# Patient Record
Sex: Male | Born: 1980 | Race: Black or African American | Hispanic: No | Marital: Single | Smoking: Current some day smoker
Health system: Southern US, Community
[De-identification: ages and names within clinical notes are randomized; demographics above are authoritative.]

---

## 2012-07-15 ENCOUNTER — Emergency Department (HOSPITAL_BASED_OUTPATIENT_CLINIC_OR_DEPARTMENT_OTHER): Payer: BC Managed Care – PPO

## 2012-07-15 ENCOUNTER — Encounter (HOSPITAL_BASED_OUTPATIENT_CLINIC_OR_DEPARTMENT_OTHER): Payer: Self-pay

## 2012-07-15 ENCOUNTER — Emergency Department (HOSPITAL_BASED_OUTPATIENT_CLINIC_OR_DEPARTMENT_OTHER)
Admission: EM | Admit: 2012-07-15 | Discharge: 2012-07-15 | Disposition: A | Discharge status: Home / Self Care | Payer: BC Managed Care – PPO | Attending: Emergency Medicine | Admitting: Emergency Medicine

## 2012-07-15 DIAGNOSIS — R509 Fever, unspecified: Secondary | ICD-10-CM | POA: Insufficient documentation

## 2012-07-15 DIAGNOSIS — IMO0001 Reserved for inherently not codable concepts without codable children: Secondary | ICD-10-CM | POA: Insufficient documentation

## 2012-07-15 DIAGNOSIS — R0602 Shortness of breath: Secondary | ICD-10-CM | POA: Insufficient documentation

## 2012-07-15 DIAGNOSIS — F17200 Nicotine dependence, unspecified, uncomplicated: Secondary | ICD-10-CM | POA: Insufficient documentation

## 2012-07-15 DIAGNOSIS — R1110 Vomiting, unspecified: Secondary | ICD-10-CM | POA: Insufficient documentation

## 2012-07-15 DIAGNOSIS — J111 Influenza due to unidentified influenza virus with other respiratory manifestations: Secondary | ICD-10-CM

## 2012-07-15 MED ORDER — OSELTAMIVIR PHOSPHATE 75 MG PO CAPS: 75.0000 mg | ORAL_CAPSULE | Freq: Two times a day (BID) | ORAL | Status: DC

## 2012-07-15 MED ORDER — HYDROCODONE-HOMATROPINE 5-1.5 MG/5ML PO SYRP: 5.0000 mL | ORAL_SOLUTION | Freq: Four times a day (QID) | ORAL | Status: AC | PRN

## 2012-07-15 MED ORDER — IBUPROFEN 800 MG PO TABS: 800.0000 mg | ORAL_TABLET | Freq: Three times a day (TID) | ORAL | Status: DC

## 2012-07-15 MED ORDER — HYDROCODONE-HOMATROPINE 5-1.5 MG/5ML PO SYRP: 5.0000 mL | ORAL_SOLUTION | Freq: Four times a day (QID) | ORAL | Status: DC | PRN

## 2012-07-15 MED ORDER — ALBUTEROL SULFATE (5 MG/ML) 0.5% IN NEBU
5.0000 mg | INHALATION_SOLUTION | Freq: Once | RESPIRATORY_TRACT | Status: AC
  Administered 2012-07-15: 5 mg via RESPIRATORY_TRACT
  Filled 2012-07-15: qty 1

## 2012-07-15 MED ORDER — IBUPROFEN 800 MG PO TABS
800.0000 mg | ORAL_TABLET | Freq: Once | ORAL | Status: AC
  Administered 2012-07-15: 800 mg via ORAL
  Filled 2012-07-15: qty 1

## 2012-07-15 MED ORDER — ACETAMINOPHEN 325 MG PO TABS
650.0000 mg | ORAL_TABLET | Freq: Once | ORAL | Status: AC
  Administered 2012-07-15: 650 mg via ORAL
  Filled 2012-07-15: qty 2

## 2012-07-15 MED ORDER — IBUPROFEN 800 MG PO TABS: 800.0000 mg | ORAL_TABLET | Freq: Three times a day (TID) | ORAL | Status: AC

## 2012-07-15 MED ORDER — OSELTAMIVIR PHOSPHATE 75 MG PO CAPS: 75.0000 mg | ORAL_CAPSULE | Freq: Two times a day (BID) | ORAL | Status: AC

## 2012-07-15 NOTE — ED Notes (Signed)
PA at bedside.

## 2012-07-15 NOTE — ED Notes (Signed)
C/o HA, fever, chills, vomiting-started this am

## 2012-07-15 NOTE — ED Provider Notes (Signed)
History     CSN: [id number]  Arrival date & time 07/15/12  2010   First MD Initiated Contact with Patient 07/15/12 2028      Chief Complaint  Patient presents with  . Fever    (Consider location/radiation/quality/duration/timing/severity/associated sxs/prior treatment) Patient is a 32 y.o. male presenting with fever. The history is provided by the patient. No language interpreter was used.  Fever Primary symptoms of the febrile illness include fever, fatigue, shortness of breath, vomiting and myalgias. Primary symptoms do not include visual change, abdominal pain, nausea, diarrhea, dysuria or arthralgias. The current episode started today. This is a new problem. The problem has been gradually worsening.  The fever began today. The fever has been unchanged since its onset. The maximum temperature recorded prior to his arrival was 100 to 100.9 F. The temperature was taken by an oral thermometer.  The shortness of breath began today. The shortness of breath developed gradually. Severity: increased SOB with giving history. The patient's medical history does not include CHF, COPD, asthma or chronic lung disease.  The vomiting began today. Vomiting occurred once. The emesis contains stomach contents.    History reviewed. No pertinent past medical history.  History reviewed. No pertinent past surgical history.  No family history on file.  History  Substance Use Topics  . Smoking status: Current Some Day Smoker  . Smokeless tobacco: Not on file  . Alcohol Use: Yes      Review of Systems  Constitutional: Positive for fever, chills and fatigue.  HENT: Negative for congestion, rhinorrhea and sinus pressure.   Eyes: Negative for photophobia and visual disturbance.  Respiratory: Positive for shortness of breath.   Cardiovascular: Negative for chest pain.  Gastrointestinal: Positive for vomiting. Negative for nausea, abdominal pain and diarrhea.  Genitourinary: Negative for dysuria.    Musculoskeletal: Positive for myalgias. Negative for arthralgias.       No recent heavy lifting or trauma  Neurological: Negative for dizziness and light-headedness.    Allergies  Review of patient's allergies indicates no known allergies.  Home Medications   Current Outpatient Rx  Name  Route  Sig  Dispense  Refill  . HYDROCODONE-HOMATROPINE 5-1.5 MG/5ML PO SYRP   Oral   Take 5 mLs by mouth every 6 (six) hours as needed for cough.   120 mL   0   . IBUPROFEN 800 MG PO TABS   Oral   Take 1 tablet (800 mg total) by mouth 3 (three) times daily.   21 tablet   0   . OSELTAMIVIR PHOSPHATE 75 MG PO CAPS   Oral   Take 1 capsule (75 mg total) by mouth 2 (two) times daily.   10 capsule   0     BP 134/76  Pulse 95  Temp 101.2 F (38.4 C) (Oral)  Resp 20  Ht 5\' 8"  (1.727 m)  Wt 180 lb (81.647 kg)  BMI 27.37 kg/m2  SpO2 100%  Physical Exam  Nursing note and vitals reviewed. Constitutional: He appears well-developed and well-nourished. No distress.  HENT:  Head: Normocephalic and atraumatic.  Eyes: Conjunctivae normal are normal. Pupils are equal, round, and reactive to light. Right eye exhibits no discharge. No scleral icterus.  Neck: Normal range of motion. Neck supple. No muscular tenderness present. No rigidity. Normal range of motion present. No Brudzinski's sign noted.  Cardiovascular: Normal rate, regular rhythm and normal heart sounds.   Pulmonary/Chest: Breath sounds normal. No respiratory distress. He has no wheezes. He exhibits no tenderness.  tachypneic  Abdominal: Soft. Bowel sounds are normal. He exhibits no distension. There is no tenderness. There is no rebound and no guarding.  Musculoskeletal: Normal range of motion. He exhibits tenderness.       Myalgias primarily shoulders, lower back, and back of legs.   Lymphadenopathy:    He has cervical adenopathy.  Neurological: He is alert.  Skin: Skin is warm. No erythema.  Psychiatric: He has a normal  mood and affect. His behavior is normal.    ED Course  Procedures (including critical care time)  Labs Reviewed - No data to display Dg Chest 2 View  07/15/2012  *RADIOLOGY REPORT*  Clinical Data: Headache with fever, chills, and vomiting  CHEST - 2 VIEW  Comparison: .  None.  Findings: Normal cardiac and mediastinal silhouette.  Clear lung fields.  No bony abnormality.  IMPRESSION: Negative chest.   Original Report Authenticated By: Davonna Belling, M.D.      1. Influenza   2. Fever       MDM  Patient presents c/o fever, headache, myalgias, and shortness of breath since this morning. Patient states he also had one episode of non-bloody, non-bilious emesis this afternoon after eating some noodle soup. Admits to sick contacts with coworkers who have had similar symptoms and diagnosed with the flu. Visibly fatigued when giving history. On physical exam patient is warm to the touch and tachypneic. Temperature at triage recorded at 101.31F. Physical exam otherwise benign. Patient given tylenol for fever. Will also obtain chest x ray to evaluate possible etiology of shortness of breath. Have ordered an albuterol neb treatment in hopes that it will help his shortness of breath symptoms as well.  Patient states SOB improved with neb treatment. Fever 1 hour after tylenol remained at 101.2. Have written for ibuprofen to be given to try and bring down the fever. CXR showed no acute abnormalities. Patient diagnosed with influenza, given the patient's history of contact with sick individuals, and onset of illness and symptoms. Plan to discharge patient with ibuprofen and hycodan. Patient does not complain of cough at this time, however possible cough will develop as today is the first day of symptoms. Patient also would like script for Tamiflu which will be written.   Antony Madura, PA-C 07/15/12 2210

## 2012-07-18 NOTE — ED Provider Notes (Signed)
Medical screening examination/treatment/procedure(s) were performed by non-physician practitioner and as supervising physician I was immediately available for consultation/collaboration.   Dione Booze, MD 07/18/12 [phone]

## 2015-08-31 ENCOUNTER — Emergency Department (HOSPITAL_BASED_OUTPATIENT_CLINIC_OR_DEPARTMENT_OTHER)
Admission: EM | Admit: 2015-08-31 | Discharge: 2015-08-31 | Disposition: A | Discharge status: Home / Self Care | Payer: Commercial Managed Care - PPO | Attending: Emergency Medicine | Admitting: Emergency Medicine

## 2015-08-31 ENCOUNTER — Encounter (HOSPITAL_BASED_OUTPATIENT_CLINIC_OR_DEPARTMENT_OTHER): Payer: Self-pay

## 2015-08-31 DIAGNOSIS — S60949A Unspecified superficial injury of unspecified finger, initial encounter: Secondary | ICD-10-CM

## 2015-08-31 DIAGNOSIS — F17200 Nicotine dependence, unspecified, uncomplicated: Secondary | ICD-10-CM | POA: Insufficient documentation

## 2015-08-31 DIAGNOSIS — Y9389 Activity, other specified: Secondary | ICD-10-CM | POA: Insufficient documentation

## 2015-08-31 DIAGNOSIS — Z79899 Other long term (current) drug therapy: Secondary | ICD-10-CM | POA: Insufficient documentation

## 2015-08-31 DIAGNOSIS — W4904XA Ring or other jewelry causing external constriction, initial encounter: Secondary | ICD-10-CM | POA: Insufficient documentation

## 2015-08-31 DIAGNOSIS — Z791 Long term (current) use of non-steroidal anti-inflammatories (NSAID): Secondary | ICD-10-CM | POA: Insufficient documentation

## 2015-08-31 DIAGNOSIS — S60942A Unspecified superficial injury of right middle finger, initial encounter: Secondary | ICD-10-CM | POA: Diagnosis present

## 2015-08-31 DIAGNOSIS — Y9289 Other specified places as the place of occurrence of the external cause: Secondary | ICD-10-CM | POA: Diagnosis not present

## 2015-08-31 DIAGNOSIS — Y998 Other external cause status: Secondary | ICD-10-CM | POA: Diagnosis not present

## 2015-08-31 NOTE — ED Notes (Signed)
Pt reports ring stuck to right middle finger x12 hours. Area swollen.

## 2015-08-31 NOTE — ED Provider Notes (Signed)
CSN: [id number]     Arrival date & time 08/31/15  1816 History   First MD Initiated Contact with Patient 08/31/15 1904     Chief Complaint  Patient presents with  . ring removal     Evelio Casad is a 35 y.o. male Who presents to the emergency department complaining of a ring stuck on his right middle finger for the past 12 hours. Patient reports he had not worn the ring for over 4 years in place on his finger last night. He reports he had difficulty taking it off afterwards. He ended up sleeping and during overnight but was unable to remove it this morning. He attempted several matted several unsuccessful to remove it. He denies any pain to his finger.   The history is provided by the patient. No language interpreter was used.    History reviewed. No pertinent past medical history. History reviewed. No pertinent past surgical history. History reviewed. No pertinent family history. Social History  Substance Use Topics  . Smoking status: Current Some Day Smoker  . Smokeless tobacco: None  . Alcohol Use: Yes    Review of Systems  Constitutional: Negative for fever.  Musculoskeletal: Negative for myalgias, joint swelling and arthralgias.  Skin: Negative for rash and wound.      Allergies  Review of patient's allergies indicates no known allergies.  Home Medications   Prior to Admission medications   Medication Sig Start Date End Date Taking? Authorizing Provider  HYDROcodone-homatropine (HYCODAN) 5-1.5 MG/5ML syrup Take 5 mLs by mouth every 6 (six) hours as needed for cough. 07/15/12   Dione Boozeavid Glick, MD  ibuprofen (ADVIL,MOTRIN) 800 MG tablet Take 1 tablet (800 mg total) by mouth 3 (three) times daily. 07/15/12   Dione Boozeavid Glick, MD  oseltamivir (TAMIFLU) 75 MG capsule Take 1 capsule (75 mg total) by mouth 2 (two) times daily. 07/15/12   Dione Boozeavid Glick, MD   BP 138/88 mmHg  Pulse 60  Temp(Src) 98.4 F (36.9 C) (Oral)  Resp 16  Ht 5\' 9"  (1.753 m)  Wt 90.719 kg  BMI 29.52 kg/m2  SpO2  100% Physical Exam  Constitutional: He appears well-developed and well-nourished. No distress.  HENT:  Head: Normocephalic and atraumatic.  Eyes: Right eye exhibits no discharge. Left eye exhibits no discharge.  Cardiovascular: Normal rate and intact distal pulses.   Right radial pulse intact. Good capillary refill to his right distal fingertips.  Pulmonary/Chest: Effort normal. No respiratory distress.  Neurological: He is alert. Coordination normal.  Skin: Skin is warm and dry. No rash noted. He is not diaphoretic. No erythema. No pallor.  Ring stuck to the patient's right middle finger. Very slight swelling of his right middle finger.  Psychiatric: He has a normal mood and affect. His behavior is normal.  Nursing note and vitals reviewed.   ED Course  Procedures (including critical care time) Labs Review Labs Reviewed - No data to display  Imaging Review No results found.    EKG Interpretation None      Filed Vitals:   08/31/15 1820 08/31/15 1821  BP:  138/88  Pulse: 60   Temp: 98.4 F (36.9 C)   TempSrc: Oral   Resp: 16   Height: 5\' 9"  (1.753 m)   Weight: 90.719 kg   SpO2: 100%      MDM   Meds given in ED:  Medications - No data to display  New Prescriptions   No medications on file    Final diagnoses:  Superficial injury of  finger, initial encounter   This  a 35 y.o. male Who presents to the emergency department complaining of a ring stuck on his right middle finger for the past 12 hours. Patient reports he had not worn the ring for over 4 years in place on his finger last night. He reports he had difficulty taking it off afterwards. He ended up sleeping and during overnight but was unable to remove it this morning. On exam patient is afebrile nontoxic appearing. He has a large ring stuck to his right middle finger. No evidence of finger injury. The ring was easily removed by nurse tech using a ring cutter. After removal patient has no injury or broken  skin. Has good range of motion of his finger. Good capillary refill. Will discharge.   Everlene Farrier, PA-C 08/31/15 1924  Rolland Porter, MD 09/10/15 2255

## 2015-08-31 NOTE — Discharge Instructions (Signed)
Buddy Taping °You have a minor finger or toe injury. It can be managed by buddy taping. Buddy taping means the injured finger or toe is taped to a healthy uninjured adjacent finger or toe. Most minor fractures and dislocations of the smaller fingers and toes will heal in 3 to 4 weeks. Buddy taping immobilizes and protects the area of injury. Buddy taping is not recommended for initial treatment of fractures of the thumb, longer fingers, or the great toe. Buddy taping should not be used for unstable or deformed fractures, but as fracture healing progresses it may be used for protection during rehabilitation. Fractured fingers and toes should be protected by buddy taping as long as the injury is still painful or swollen.  °When an injury is buddy taped, place a small piece of gauze or cotton between the digits that are taped. This helps prevent the skin from breaking down from increased moisture. Buddy taping allows you to get your injury wet when you bathe. Change the gauze and tape more often if it gets wet, and dry the space between the finger or toes. Use a sturdy, hard-soled shoe for better support if you have a fractured toe. In 2 to 3 weeks you can start motion exercises. This will keep the fingers or toes from becoming stiff.  °SEEK IMMEDIATE MEDICAL CARE IF:  °· The injured area becomes cold, numb, or pale. °· You have pain not controlled with medications. °· You notice increasing deformity of the toe or finger. °  °This information is not intended to replace advice given to you by your health care provider. Make sure you discuss any questions you have with your health care provider. °  °Document Released: 07/12/2004 Document Revised: 06/25/2014 Document Reviewed: 10/27/2014 °Elsevier Interactive Patient Education ©2016 [employer] ° °

## 2019-08-03 ENCOUNTER — Ambulatory Visit (INDEPENDENT_AMBULATORY_CARE_PROVIDER_SITE_OTHER): Payer: BC Managed Care – PPO | Admitting: Family Medicine

## 2019-08-03 ENCOUNTER — Ambulatory Visit (HOSPITAL_BASED_OUTPATIENT_CLINIC_OR_DEPARTMENT_OTHER)
Admission: RE | Admit: 2019-08-03 | Discharge: 2019-08-03 | Disposition: A | Discharge status: Home / Self Care | Payer: BC Managed Care – PPO | Source: Ambulatory Visit | Attending: Family Medicine | Admitting: Family Medicine

## 2019-08-03 ENCOUNTER — Encounter: Payer: Self-pay | Admitting: Family Medicine

## 2019-08-03 ENCOUNTER — Other Ambulatory Visit: Payer: Self-pay

## 2019-08-03 VITALS — Ht 68.0 in | Wt 180.0 lb

## 2019-08-03 DIAGNOSIS — M545 Low back pain, unspecified: Secondary | ICD-10-CM

## 2019-08-03 DIAGNOSIS — G8929 Other chronic pain: Secondary | ICD-10-CM

## 2019-08-03 MED ORDER — CYCLOBENZAPRINE HCL 10 MG PO TABS: 10.0000 mg | ORAL_TABLET | Freq: Three times a day (TID) | ORAL | 1 refills | Status: AC | PRN

## 2019-08-03 NOTE — Progress Notes (Signed)
Brad Cobb - 39 y.o. male MRN [id number]  Date of birth: 12-Jan-1981  SUBJECTIVE:  Including CC & ROS.  Chief Complaint  Patient presents with  . Back Pain    Brad Cobb is a 39 y.o. male that is presenting with acute on chronic low back pain.  This occurs in the left and right side.  He reports only intermittent and mild sciatic symptoms at times.  His pain seems to be worse with certain sudden movement.  Previously it only lasted a few days.  The most recent episode has lasted about 3 weeks.  Denies any history of kidney stones.  Reports a distant history of back muscle tear.  No back surgery.  No previous work-up.   Review of Systems See HPI   HISTORY: Past Medical, Surgical, Social, and Family History Reviewed & Updated per EMR.   Pertinent Historical Findings include:  No past medical history on file.  No past surgical history on file.  No family history on file.  Social History   Socioeconomic History  . Marital status: Single    Spouse name: Not on file  . Number of children: Not on file  . Years of education: Not on file  . Highest education level: Not on file  Occupational History  . Not on file  Tobacco Use  . Smoking status: Current Some Day Smoker  . Smokeless tobacco: Never Used  Substance and Sexual Activity  . Alcohol use: Yes  . Drug use: No  . Sexual activity: Not on file  Other Topics Concern  . Not on file  Social History Narrative  . Not on file   Social Determinants of Health   Financial Resource Strain:   . Difficulty of Paying Living Expenses: Not on file  Food Insecurity:   . Worried About Programme researcher, broadcasting/film/video in the Last Year: Not on file  . Ran Out of Food in the Last Year: Not on file  Transportation Needs:   . Lack of Transportation (Medical): Not on file  . Lack of Transportation (Non-Medical): Not on file  Physical Activity:   . Days of Exercise per Week: Not on file  . Minutes of Exercise per Session: Not on file  Stress:   .  Feeling of Stress : Not on file  Social Connections:   . Frequency of Communication with Friends and Family: Not on file  . Frequency of Social Gatherings with Friends and Family: Not on file  . Attends Religious Services: Not on file  . Active Member of Clubs or Organizations: Not on file  . Attends Banker Meetings: Not on file  . Marital Status: Not on file  Intimate Partner Violence:   . Fear of Current or Ex-Partner: Not on file  . Emotionally Abused: Not on file  . Physically Abused: Not on file  . Sexually Abused: Not on file     PHYSICAL EXAM:  VS: Ht 5\' 8"  (1.727 m)   Wt 180 lb (81.6 kg)   BMI 27.37 kg/m  Physical Exam Gen: NAD, alert, cooperative with exam, well-appearing MSK:  Back: Normal flexion and extension. No tenderness to palpation midline spine. Normal strength resistance with hip flexion. Negative straight leg raise anterior tilt of the pelvis appreciated. Neurovascularly intact     ASSESSMENT & PLAN:   Chronic bilateral low back pain without sciatica Acute on chronic in nature.  Seems to have more of an imbalance as to the source of his pain.  Likely spasm.  Seems less likely for disc origin or nerve. -Provided Duexis samples. -Flexeril. -Counseled on home exercise therapy and supportive care. -X-ray. -If no improvement will consider physical therapy

## 2019-08-03 NOTE — Patient Instructions (Signed)
Nice to meet you Please try the duexis for 3 days straight and then as needed  Please try the heat for the lower back  Please try the exercises  Please try the muscle relaxer. This may make you sleepy.   I will call with the results.  Please send me a message in MyChart with any questions or updates.  Please see me back in 4 weeks.   --Dr. Jordan Likes

## 2019-08-03 NOTE — Progress Notes (Signed)
Medication Samples have been provided to the patient.  Drug name: Duexis       Strength: 800mg /26.6mg         Qty: 2 boxes  LOT:  Exp.Date: 06/2020  Dosing instructions: Take 1 tablet by mouth three (3) times a day.  The patient has been instructed regarding the correct time, dose, and frequency of taking this medication, including desired effects and most common side effects.   07/2020, Kathi Simpers 11:02 AM 08/03/2019

## 2019-08-03 NOTE — Assessment & Plan Note (Signed)
Acute on chronic in nature.  Seems to have more of an imbalance as to the source of his pain.  Likely spasm.  Seems less likely for disc origin or nerve. -Provided Duexis samples. -Flexeril. -Counseled on home exercise therapy and supportive care. -X-ray. -If no improvement will consider physical therapy

## 2019-08-04 ENCOUNTER — Telehealth: Payer: Self-pay | Admitting: Family Medicine

## 2019-08-04 NOTE — Telephone Encounter (Signed)
Patient returning call for xray results  

## 2019-08-04 NOTE — Telephone Encounter (Signed)
Left VM for patient. If he calls back please have him speak with a nurse/CMA and his x-ray is showing minimal degenerative changes but does show a large stool burden.  Would advise him to be on a fiber supplement if he is not already.  This may be contributing to some of the pain that he experiences.  Otherwise would continue with home exercises and planned follow-up.   If any questions then please take the best time and phone number to call and I will try to call him back.   Myra Rude, MD [facility] 08/04/2019, 10:23 AM

## 2019-08-04 NOTE — Telephone Encounter (Signed)
Spoke to patient and gave him result information as provided by the physician. 

## 2019-09-01 ENCOUNTER — Encounter: Payer: Self-pay | Admitting: Family Medicine

## 2019-09-01 ENCOUNTER — Ambulatory Visit (INDEPENDENT_AMBULATORY_CARE_PROVIDER_SITE_OTHER): Payer: BC Managed Care – PPO | Admitting: Family Medicine

## 2019-09-01 ENCOUNTER — Other Ambulatory Visit: Payer: Self-pay

## 2019-09-01 DIAGNOSIS — M545 Low back pain: Secondary | ICD-10-CM | POA: Diagnosis not present

## 2019-09-01 DIAGNOSIS — G8929 Other chronic pain: Secondary | ICD-10-CM

## 2019-09-01 NOTE — Progress Notes (Signed)
  Brad Cobb - 39 y.o. male MRN [id number]  Date of birth: 09/04/1980  SUBJECTIVE:  Including CC & ROS.  Chief Complaint  Patient presents with  . Follow-up    follow up for bilateral low back    Brad Cobb is a 39 y.o. male that is following up for his low back pain.  He reports some improvement.  He has been doing home exercises.  He notices improvement of the pain when he produces a stool.  He has been using fiber Gummies.  His x-ray did show a significant amount of stool burden.  He has been having these difficulties for the past 3 to 4 years..  Independent review of the lumbar spine x-ray from 2/15 shows no significant acute changes.   Review of Systems See HPI   HISTORY: Past Medical, Surgical, Social, and Family History Reviewed & Updated per EMR.   Pertinent Historical Findings include:  No past medical history on file.  No past surgical history on file.  No family history on file.  Social History   Socioeconomic History  . Marital status: Single    Spouse name: Not on file  . Number of children: Not on file  . Years of education: Not on file  . Highest education level: Not on file  Occupational History  . Not on file  Tobacco Use  . Smoking status: Current Some Day Smoker  . Smokeless tobacco: Never Used  Substance and Sexual Activity  . Alcohol use: Yes  . Drug use: No  . Sexual activity: Not on file  Other Topics Concern  . Not on file  Social History Narrative  . Not on file   Social Determinants of Health   Financial Resource Strain:   . Difficulty of Paying Living Expenses:   Food Insecurity:   . Worried About Programme researcher, broadcasting/film/video in the Last Year:   . Barista in the Last Year:   Transportation Needs:   . Freight forwarder (Medical):   Marland Kitchen Lack of Transportation (Non-Medical):   Physical Activity:   . Days of Exercise per Week:   . Minutes of Exercise per Session:   Stress:   . Feeling of Stress :   Social Connections:   . Frequency  of Communication with Friends and Family:   . Frequency of Social Gatherings with Friends and Family:   . Attends Religious Services:   . Active Member of Clubs or Organizations:   . Attends Banker Meetings:   Marland Kitchen Marital Status:   Intimate Partner Violence:   . Fear of Current or Ex-Partner:   . Emotionally Abused:   Marland Kitchen Physically Abused:   . Sexually Abused:      PHYSICAL EXAM:  VS: BP (!) 147/98   Pulse 65   Ht 5\' 8"  (1.727 m)   Wt 187 lb (84.8 kg)   BMI 28.43 kg/m  Physical Exam Gen: NAD, alert, cooperative with exam, well-appearing MSK:  Back: Normal range of motion. Normal strength resistance. Neurovascular intact     ASSESSMENT & PLAN:   Chronic bilateral low back pain without sciatica Slightly improvement of his pain.  His stool burden may be more associated with his pain as opposed to being musculoskeletal in nature. -Counseled on home exercise therapy and supportive care. -Counseled on MiraLAX. -Could consider referral to physical therapy or gastroenterology.

## 2019-09-01 NOTE — Patient Instructions (Signed)
Good to see you Please try miralax and adjusting the dose   Please send me a message in MyChart with any questions or updates.  Please see me back in 6-8 weeks or as needed.   --Dr. Jordan Likes  Rx Miralax  start with 1/2 cap daily and may increase to 1 cap daily to achieve 1-2 soft stools per day.  Increase fruits, veggies, and water in diet.

## 2019-09-01 NOTE — Assessment & Plan Note (Signed)
Slightly improvement of his pain.  His stool burden may be more associated with his pain as opposed to being musculoskeletal in nature. -Counseled on home exercise therapy and supportive care. -Counseled on MiraLAX. -Could consider referral to physical therapy or gastroenterology.

## 2021-05-09 IMAGING — DX DG LUMBAR SPINE 2-3V
3 series · 3 of 3 positions shown · non-contrast
Comparison: No pertinent prior studies available for comparison.

CLINICAL DATA: Low back pain. Additional history provided by
technologist: Patient reports low back pain, right side worse than
left, no known injury.

EXAM:
LUMBAR SPINE - 2-3 VIEW

[l-spine ap]
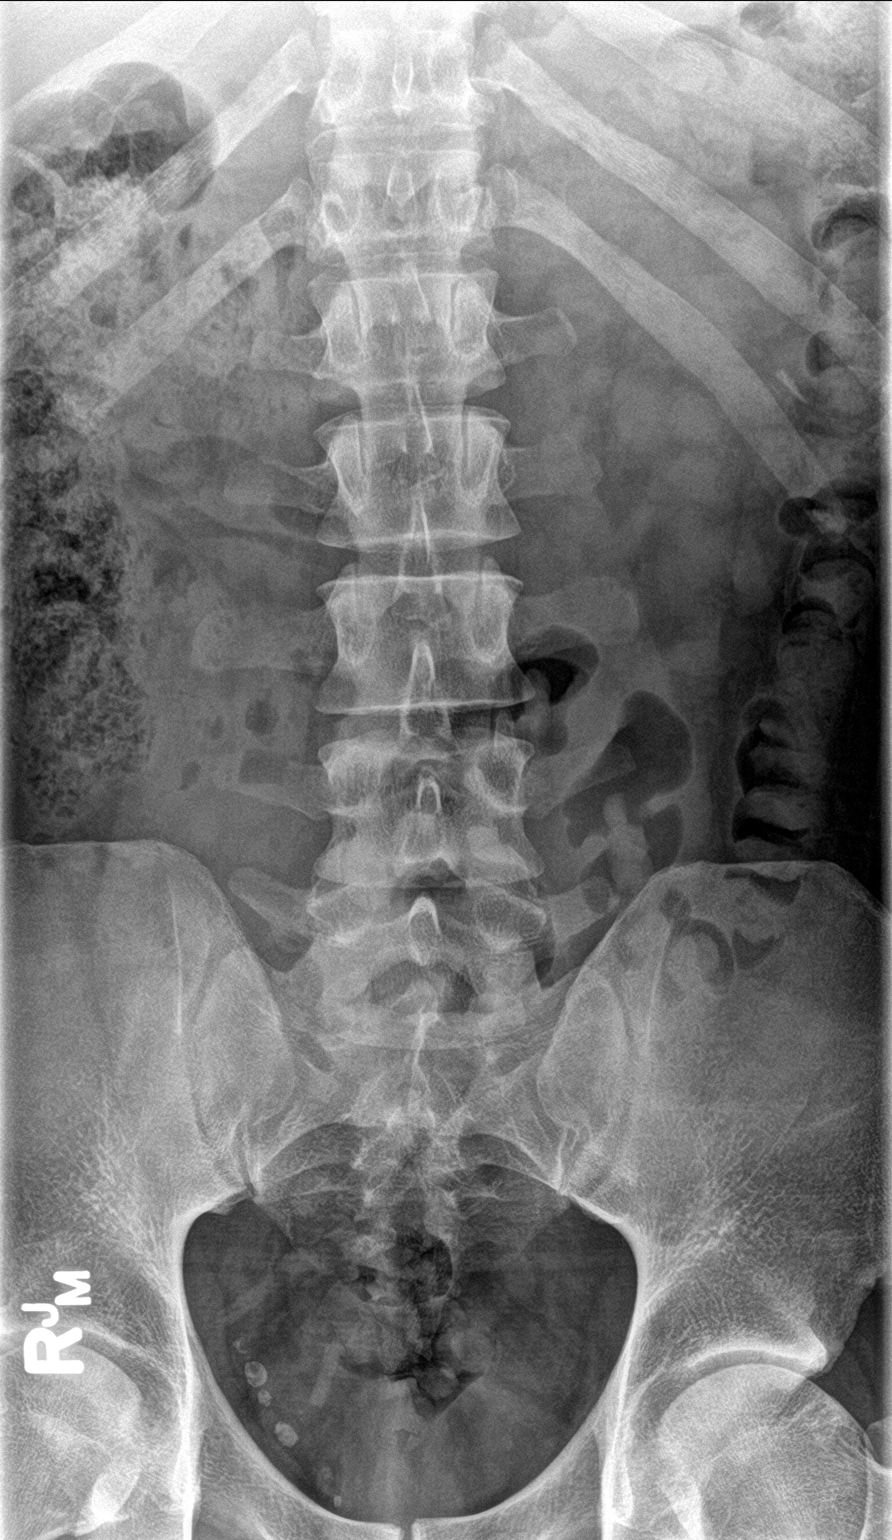

[l-spine lat]
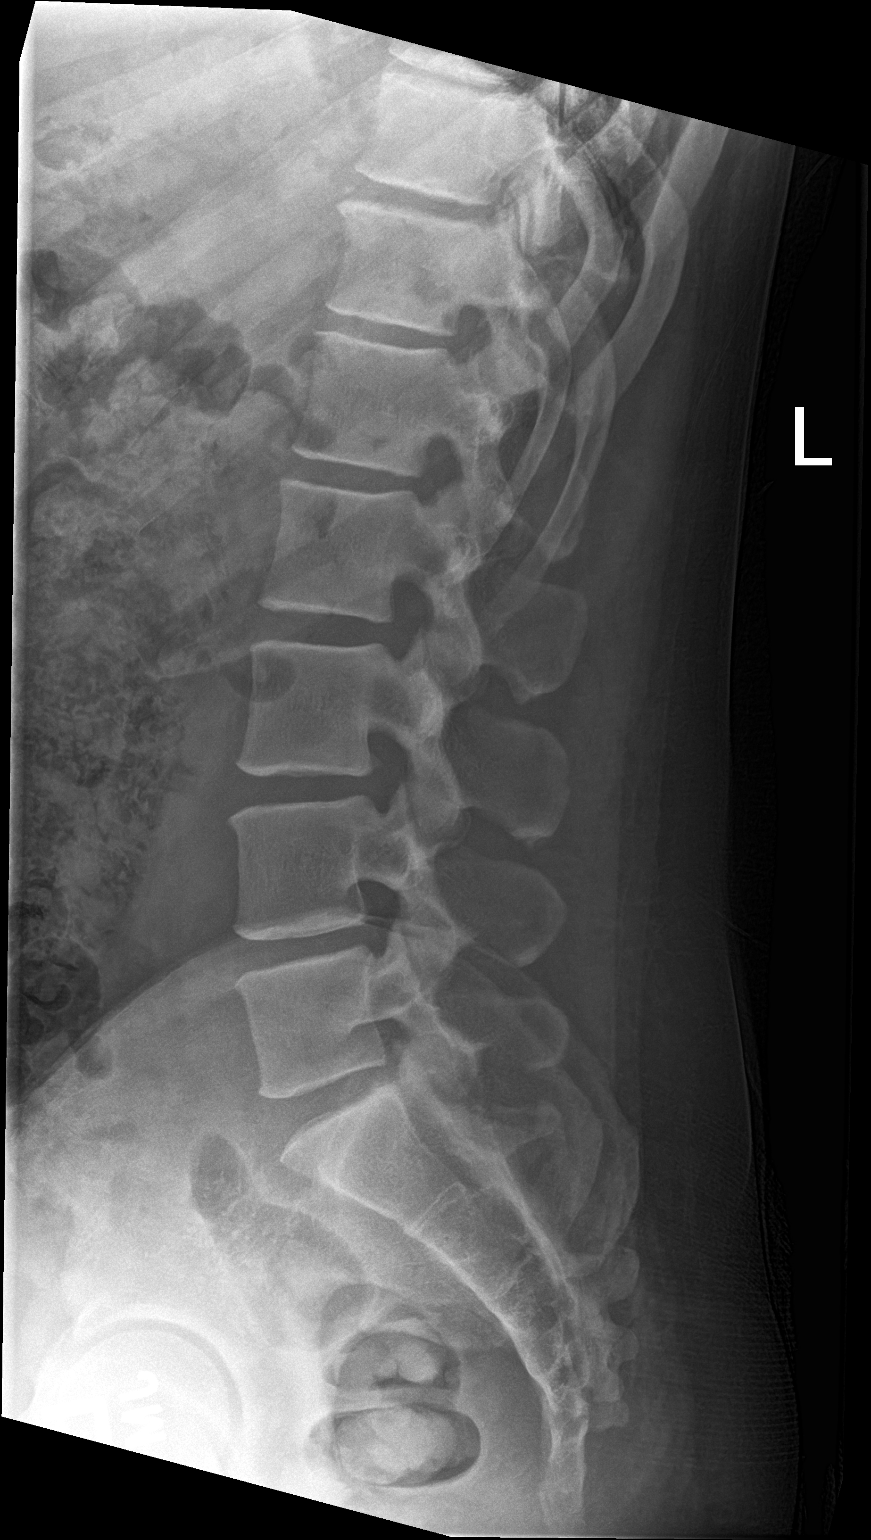

[l-spine spot]
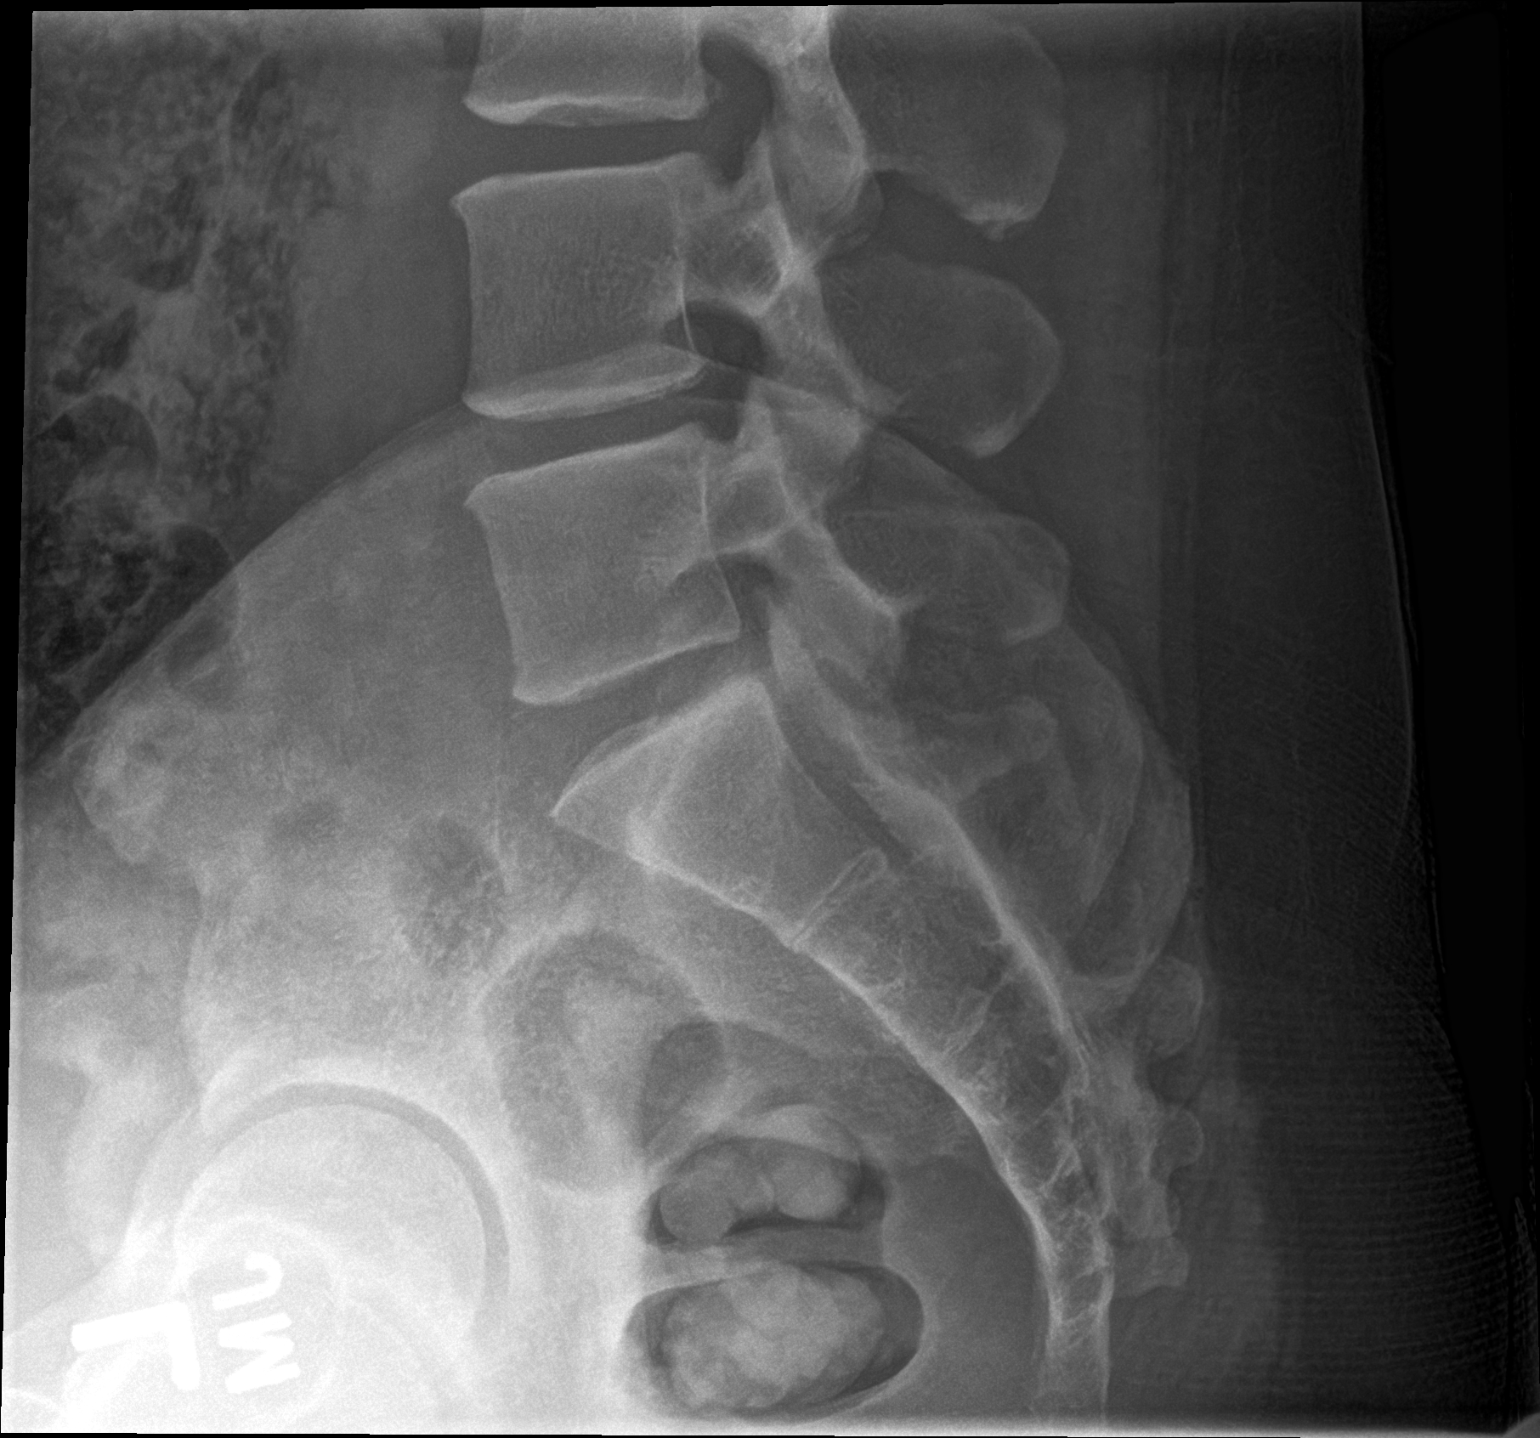

[3 of 3 positions shown; findings below may reference images not displayed]

FINDINGS: Five lumbar vertebrae. Trace L2-L3 retrolisthesis. No compression
deformity. Intervertebral disc height is maintained. Minimal L5-S1
facet arthrosis. Moderate stool burden within the right colon.
IMPRESSION: No lumbar compression deformity.

Intervertebral disc height is preserved.

Minimal L5-S1 facet arthrosis.

Moderate stool burden within the right colon.

## 2022-10-01 ENCOUNTER — Encounter: Payer: Self-pay | Admitting: *Deleted
